# Patient Record
Sex: Female | Born: 1960 | Race: White | Hispanic: No | Marital: Married | Smoking: Former smoker
Health system: Southern US, Community
[De-identification: ages and names within clinical notes are randomized; demographics above are authoritative.]

## PROBLEM LIST (undated history)

## (undated) DIAGNOSIS — M169 Osteoarthritis of hip, unspecified: Secondary | ICD-10-CM

## (undated) DIAGNOSIS — M545 Low back pain, unspecified: Secondary | ICD-10-CM

## (undated) DIAGNOSIS — M47816 Spondylosis without myelopathy or radiculopathy, lumbar region: Secondary | ICD-10-CM

## (undated) DIAGNOSIS — R209 Unspecified disturbances of skin sensation: Secondary | ICD-10-CM

## (undated) HISTORY — DX: Osteoarthritis of hip, unspecified: M16.9

## (undated) HISTORY — DX: Low back pain: M54.5

## (undated) HISTORY — DX: Low back pain, unspecified: M54.50

## (undated) HISTORY — DX: Spondylosis without myelopathy or radiculopathy, lumbar region: M47.816

## (undated) HISTORY — DX: Unspecified disturbances of skin sensation: R20.9

---

## 1986-10-06 HISTORY — PX: BACK SURGERY: SHX140

## 2003-10-07 HISTORY — PX: OTHER SURGICAL HISTORY: SHX169

## 2012-05-13 ENCOUNTER — Encounter: Payer: Self-pay | Admitting: Emergency Medicine

## 2012-05-14 ENCOUNTER — Ambulatory Visit (INDEPENDENT_AMBULATORY_CARE_PROVIDER_SITE_OTHER): Payer: 59 | Admitting: Emergency Medicine

## 2012-05-14 ENCOUNTER — Encounter: Payer: Self-pay | Admitting: Emergency Medicine

## 2012-05-14 VITALS — BP 108/78 | HR 86 | Temp 97.8°F | Ht 60.0 in | Wt 175.0 lb

## 2012-05-14 DIAGNOSIS — R911 Solitary pulmonary nodule: Secondary | ICD-10-CM | POA: Insufficient documentation

## 2012-05-14 NOTE — Assessment & Plan Note (Signed)
Need to repeat Ct scan (2 yr scan) and get the old films for comparison

## 2012-05-14 NOTE — Patient Instructions (Addendum)
We will repeat your CT scan of the chest now Follow with Dr Delton Coombes next available opening after the CT scan is done Bring your old films with you in case we are unable to obtain copies

## 2012-05-14 NOTE — Progress Notes (Signed)
Subjective:    Patient ID: Sherry Green, female    DOB: 09-16-1961, 51 y.o.   MRN: [id number]  HPI 51 yo woman, 10 pk-yrs tobacco stopped 1996, hx of OA/back pain. She apparently underwent CXR and then CT scan May 2011 that identified pulmonary nodule(s) RLL. I do not have the films available today. She was followed by Dr Chancy Milroy, repeat CT scan done in Summer 2012 was reportedly stable. She hasn't had repeat scan yet.  She denies any cough, hemoptysis. Some exertional SOB with stairs, but able to exert.    Review of Systems  Constitutional: Negative for fever, chills, diaphoresis, activity change, appetite change, fatigue and unexpected weight change.  HENT: Positive for postnasal drip and sinus pressure. Negative for nosebleeds, congestion, sore throat, rhinorrhea, sneezing, mouth sores, trouble swallowing, dental problem and voice change.   Eyes: Negative for photophobia, discharge, itching and visual disturbance.  Respiratory: Positive for shortness of breath. Negative for apnea, cough, choking, chest tightness and wheezing.   Cardiovascular: Negative for chest pain, palpitations and leg swelling.  Gastrointestinal: Negative for nausea, vomiting, abdominal pain, constipation, blood in stool and abdominal distention.  Genitourinary: Negative for dysuria, urgency, decreased urine volume and difficulty urinating.  Musculoskeletal: Negative for myalgias and gait problem.  Skin: Negative for rash.  Neurological: Negative for dizziness, seizures, syncope, weakness, light-headedness, numbness and headaches.  Hematological: Bruises/bleeds easily.  Psychiatric/Behavioral: Negative for confusion, disturbed wake/sleep cycle and agitation. The patient is not nervous/anxious.     Past Medical History  Diagnosis Date  . Osteoarthritis of hip   . Lower back pain   . Lumbar spondylosis   . Disturbance of skin sensation     numbness/tingling     Family History  Problem Relation Age of Onset  .  Congenital heart disease      both sides of family  . Dementia    . Diabetes    . Stroke    . Cancer Maternal Grandfather   . Cancer Maternal Aunt     ovarian  . Cancer Mother     rectal  . Clotting disorder Mother     blood clots in legs     History   Social History  . Marital Status: Married    Spouse Name: N/A    Number of Children: 1  . Years of Education: N/A   Occupational History  . collections [employer]   Social History Main Topics  . Smoking status: Former Smoker -- 2.0 packs/day for 5 years    Types: Cigarettes    Quit date: 05/07/1995  . Smokeless tobacco: Never Used   Comment: quit 16 years ago  . Alcohol Use: Yes     occassional  . Drug Use: No  . Sexually Active: Not on file   Other Topics Concern  . Not on file   Social History Narrative  . No narrative on file     Allergies  Allergen Reactions  . No Known Drug Allergy      No outpatient prescriptions prior to visit.         Objective:   Physical Exam Filed Vitals:   05/14/12 1348  BP: 108/78  Pulse: 86  Temp: 97.8 F (36.6 C)   Gen: Pleasant, overwt woman, in no distress,  normal affect  ENT: No lesions,  mouth clear,  oropharynx clear, no postnasal drip  Neck: No JVD, no TMG, no carotid bruits  Lungs: No use of accessory muscles, no dullness to percussion, clear without rales  or rhonchi  Cardiovascular: RRR, heart sounds normal, no murmur or gallops, no peripheral edema  Musculoskeletal: No deformities, no cyanosis or clubbing  Neuro: alert, non focal  Skin: Warm, no lesions or rashes     Assessment & Plan:  Pulmonary nodule, right Need to repeat Ct scan (2 yr scan) and get the old films for comparison

## 2012-05-21 ENCOUNTER — Ambulatory Visit (INDEPENDENT_AMBULATORY_CARE_PROVIDER_SITE_OTHER)
Admission: RE | Admit: 2012-05-21 | Discharge: 2012-05-21 | Disposition: A | Discharge status: Home / Self Care | Payer: 59 | Source: Ambulatory Visit | Attending: Emergency Medicine | Admitting: Emergency Medicine

## 2012-05-21 DIAGNOSIS — R911 Solitary pulmonary nodule: Secondary | ICD-10-CM

## 2012-05-21 MED ORDER — IOHEXOL 300 MG/ML  SOLN
80.0000 mL | Freq: Once | INTRAMUSCULAR | Status: AC | PRN
  Administered 2012-05-21: 80 mL via INTRAVENOUS

## 2012-06-18 ENCOUNTER — Ambulatory Visit (INDEPENDENT_AMBULATORY_CARE_PROVIDER_SITE_OTHER): Payer: 59 | Admitting: Emergency Medicine

## 2012-06-18 ENCOUNTER — Encounter: Payer: Self-pay | Admitting: Emergency Medicine

## 2012-06-18 VITALS — BP 112/68 | HR 75 | Temp 97.7°F | Ht 60.0 in | Wt 150.4 lb

## 2012-06-18 DIAGNOSIS — R911 Solitary pulmonary nodule: Secondary | ICD-10-CM

## 2012-06-18 NOTE — Assessment & Plan Note (Signed)
CT 05/21/12 shows probable stable nodules, although I need to get the comparison films - she says she dropped them off at [facility]. We will compare them, have radiology compare them. If stable then may not need repeat CT but we may discuss other eval of the nodules - ie FOB, etc.

## 2012-06-18 NOTE — Patient Instructions (Addendum)
We will obtain your CT scans from [facility] to compare to your current scan.  Depending on these results we will decide which testing might be appropriate Follow with Dr Delton Coombes in 6 months

## 2012-06-18 NOTE — Progress Notes (Signed)
  Subjective:    Patient ID: Sherry Green, female    DOB: 05/21/1961, 51 y.o.   MRN: [id number]  HPI 51 yo woman, 10 pk-yrs tobacco stopped 1996, hx of OA/back pain. She apparently underwent CXR and then CT scan May 2011 that identified pulmonary nodule(s) RLL. I do not have the films available today. She was followed by Dr Chancy Milroy, repeat CT scan done in Summer 2012 was reportedly stable. She hasn't had repeat scan yet.  She denies any cough, hemoptysis. Some exertional SOB with stairs, but able to exert.   ROV 06/18/12 -- hx tobacco, RLL nodule reportedly seen first in May 2011 at [facility]. She returns now after having repeat scan on 05/21/12:    CT Scan 05/21/12 -  Comparison: None.  Findings: 6 mm subpleural nodule in the posterior right upper lobe  (series 3/image 8). 6 mm subpleural nodule in the posterior left  lower lobe (series/image 11). 4 mm nodule in the left lower lobe  (series 3/image 11). 6 mm subpleural nodule in the right lower  lobe (series 3/image 29). 3 mm subpleural nodule in the right  lower lobe (series 3/image 34).  Increased interstitial markings/subpleural reticulation, suggesting  underlying chronic interstitial lung disease.  No pleural effusion or pneumothorax.  Visualized thyroid is unremarkable.  The heart is normal in size. No pericardial effusion. Mild  atherosclerotic calcifications of the aortic arch.  Small mediastinal/left hilar lymph nodes which do not meet  pathologic CT size criteria.  Visualized upper abdomen is unremarkable.  Degenerative changes of the visualized thoracolumbar spine.  IMPRESSION:  Bilateral pulmonary nodules, measuring up to 6 mm, as described  above. Correlate with prior imaging, if available. Otherwise,  follow-up CT chest is suggested in 6-12 months per [employer]  Society guidelines.  Suspected underlying chronic interstitial lung disease.      Objective:   Physical Exam Filed Vitals:   06/18/12 1322  BP: 112/68   Pulse: 75  Temp: 97.7 F (36.5 C)   Gen: Pleasant, overwt woman, in no distress,  normal affect  ENT: No lesions,  mouth clear,  oropharynx clear, no postnasal drip  Neck: No JVD, no TMG, no carotid bruits  Lungs: No use of accessory muscles, no dullness to percussion, clear without rales or rhonchi  Cardiovascular: RRR, heart sounds normal, no murmur or gallops, no peripheral edema  Musculoskeletal: No deformities, no cyanosis or clubbing  Neuro: alert, non focal  Skin: Warm, no lesions or rashes     Assessment & Plan:  Pulmonary nodule, right CT 05/21/12 shows probable stable nodules, although I need to get the comparison films - she says she dropped them off at [facility]. We will compare them, have radiology compare them. If stable then may not need repeat CT but we may discuss other eval of the nodules - ie FOB, etc.

## 2012-08-25 NOTE — Progress Notes (Signed)
Quick Note:  Spoke with [facility], CT images on disc are being mailed. ______

## 2013-06-30 IMAGING — CT CT CHEST W/ CM
2 of 3 series · 15 of 36 positions shown, 18 images · IV contrast (Omnipaque 300)
Comparison: None.

CLINICAL DATA: Follow up right lower lobe nodule from outside
hospital

CT CHEST WITH CONTRAST
TECHNIQUE: Multidetector CT imaging of the chest was performed
following the standard protocol during bolus administration of
intravenous contrast.
Contrast: 80mL OMNIPAQUE IOHEXOL 300 MG/ML  SOLN

[Series 2: chest routine with · axial · 0.73mm/px · z∈[-250,-30]mm · 12 of 52 slices shown, 15 images]
[im 4/52  mediastinal]
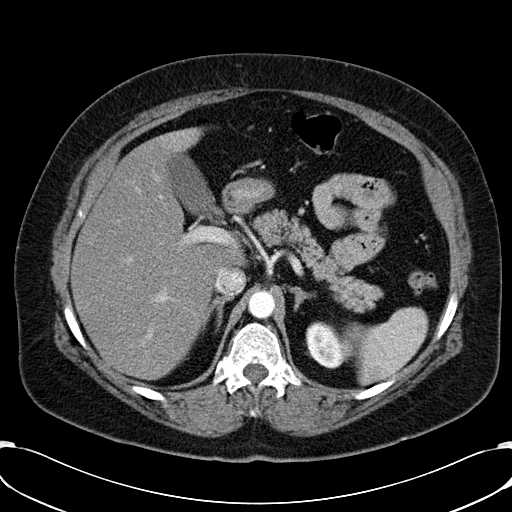
[im 4/52  lung]
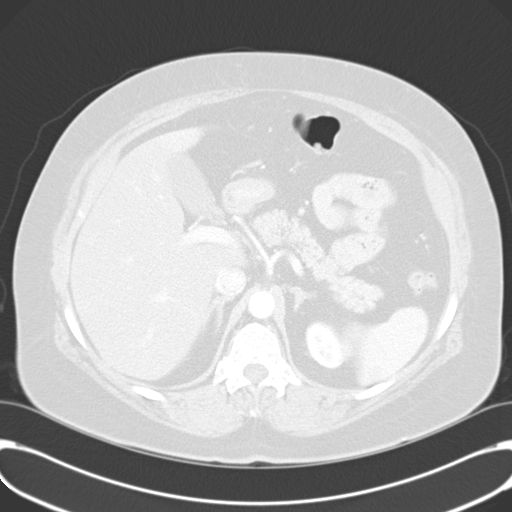
[im 8/52  lung]
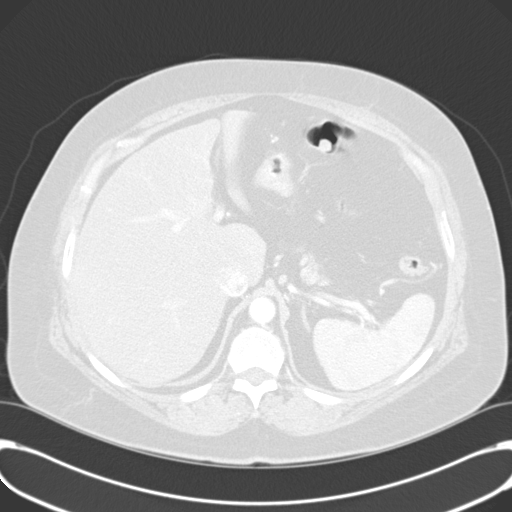
[im 12/52  lung]
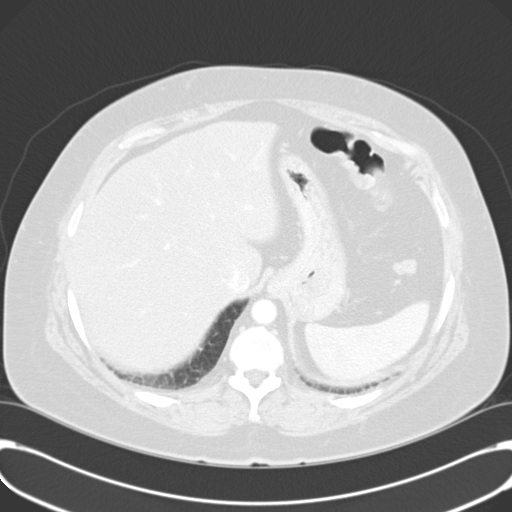
[im 16/52  lung]
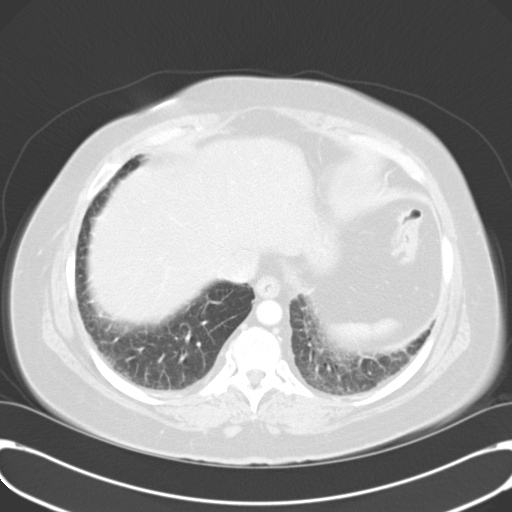
[im 19/52  mediastinal]
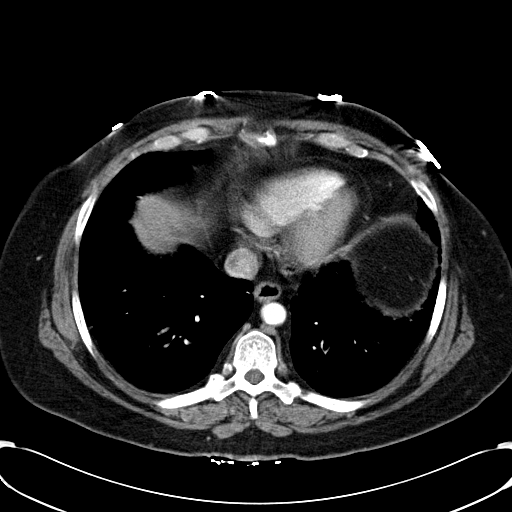
[im 19/52  lung]
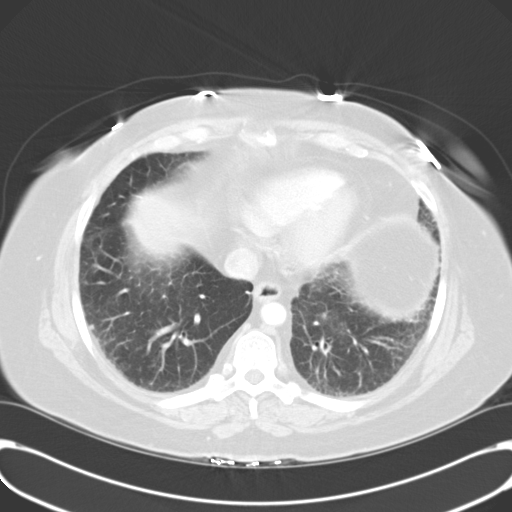
[im 23/52  lung]
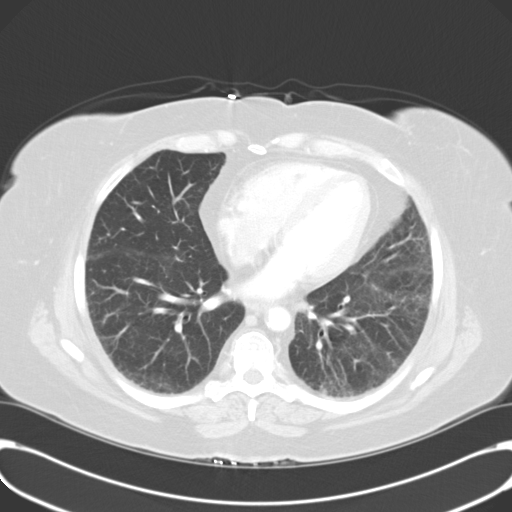
[im 29/52  lung]
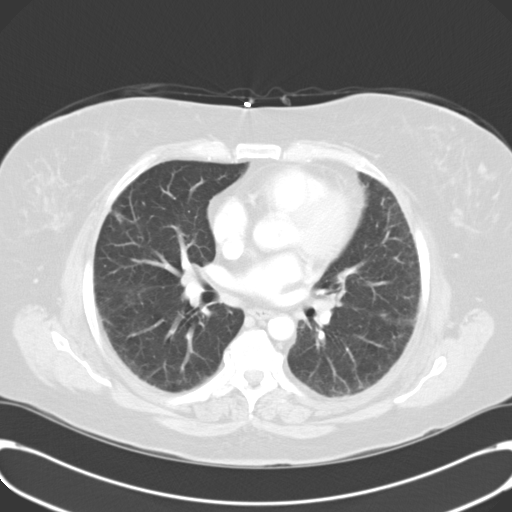
[im 33/52  lung]
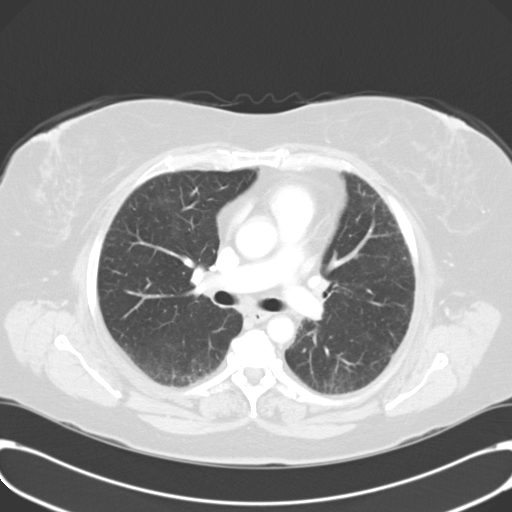
[im 36/52  mediastinal]
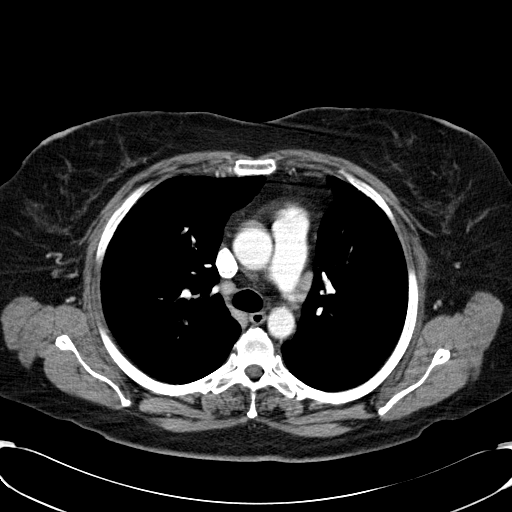
[im 36/52  lung]
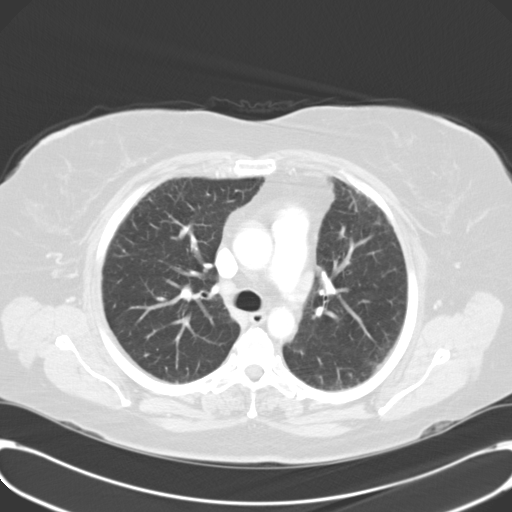
[im 40/52  lung]
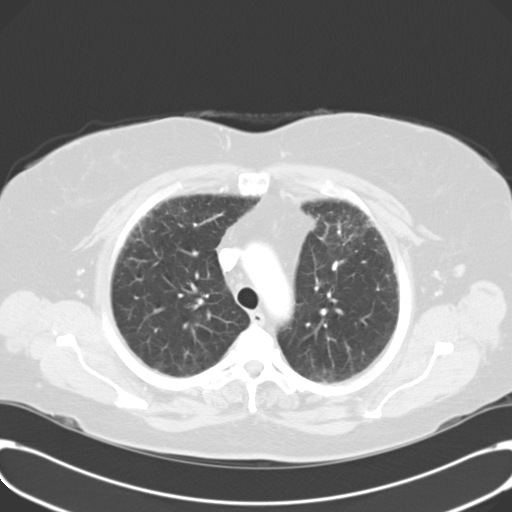
[im 44/52  lung]
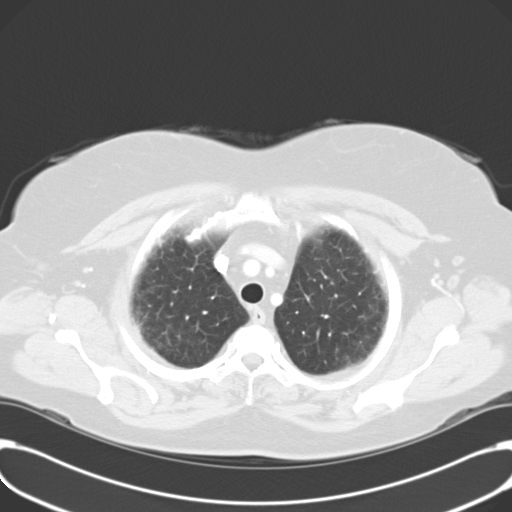
[im 48/52  lung]
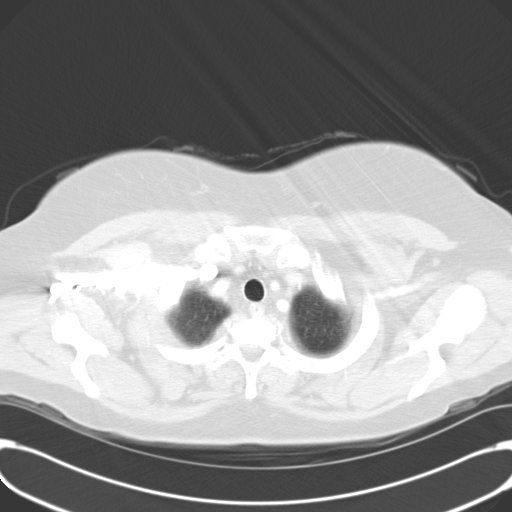

[Series 602: cor · coronal · 0.73mm/px · 3 of 101 slices shown]
[im 21/101  lung]
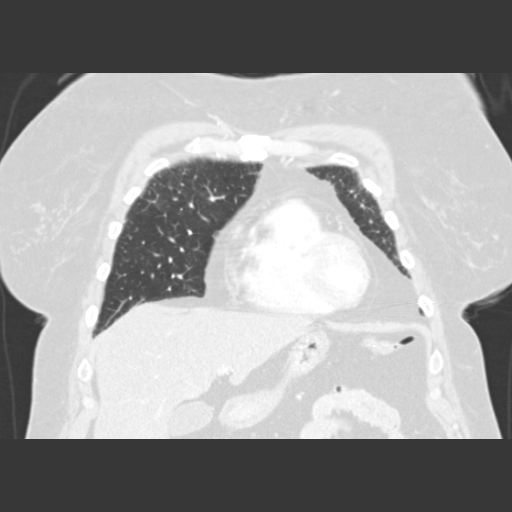
[im 41/101  lung]
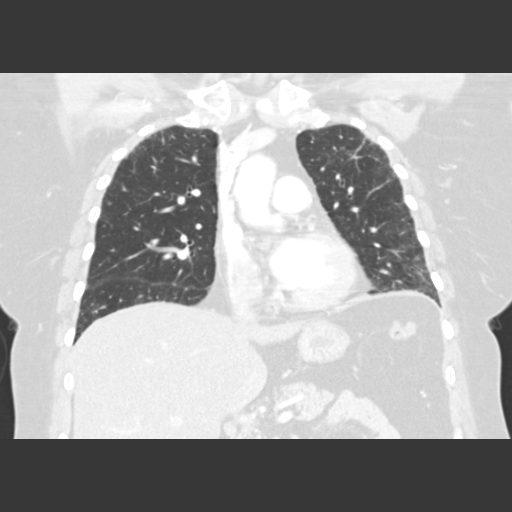
[im 61/101  lung]
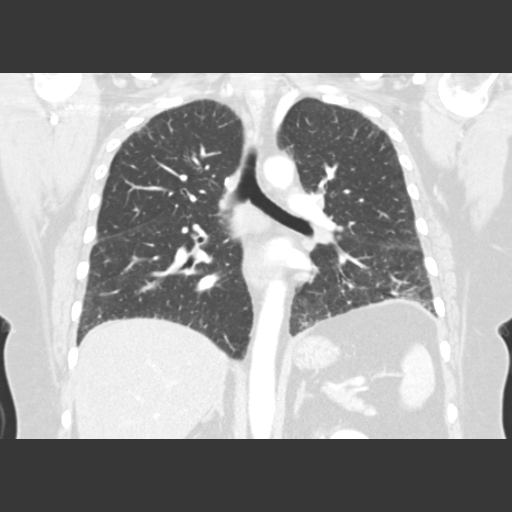

[15 of 36 positions shown; findings below may reference images not displayed]

FINDINGS: 6 mm subpleural nodule in the posterior right upper lobe
(series 3/image 8).  6 mm subpleural nodule in the posterior left
lower lobe (series/image 11). 4 mm nodule in the left lower lobe
(series 3/image 11).  6 mm subpleural nodule in the right lower
lobe (series 3/image 29).  3 mm subpleural nodule in the right
lower lobe (series 3/image 34).

Increased interstitial markings/subpleural reticulation, suggesting
underlying chronic interstitial lung disease.

No pleural effusion or pneumothorax.

Visualized thyroid is unremarkable.

The heart is normal in size.  No pericardial effusion.  Mild
atherosclerotic calcifications of the aortic arch.

Small mediastinal/left hilar lymph nodes which do not meet
pathologic CT size criteria.

Visualized upper abdomen is unremarkable.

Degenerative changes of the visualized thoracolumbar spine.
IMPRESSION: Bilateral pulmonary nodules, measuring up to 6 mm, as described
above.  Correlate with prior imaging, if available.  Otherwise,
follow-up CT chest is suggested in 6-12 months per [place]
Society guidelines.

Suspected underlying chronic interstitial lung disease.

Above recommendation follows the consensus statement: Guidelines
for Management of Small Pulmonary Nodules Detected on CT Scans: A
Statement from the [HOSPITAL] as published in Radiology

## 2013-11-03 ENCOUNTER — Telehealth: Payer: Self-pay | Admitting: Emergency Medicine

## 2013-11-03 NOTE — Telephone Encounter (Signed)
I called and spoke with Rose from [facility]. She will place this on a disk for pt and she can pick this up from them tomorrow.  I called and made pt aware. She needed nothing further.

## 2013-11-03 NOTE — Telephone Encounter (Signed)
Spoke to pt and she said that she never heard back from Dr Delton CoombesByrum regarding comparison of CT scans.  She is still having problems and needs to get CT scan discs form 2012 that were supposedly sent to our office.  Dr Delton CoombesByrum please advise on this

## 2013-11-03 NOTE — Telephone Encounter (Signed)
Spoke with the pt. She was treated for bronchitis and evaluated by cardiology for possible PAH in [place]. She calls now reporting that she wants to obtain her old scan disks, that she had a new CT scan at [facility] 2 weeks ago for comparison, and is now being evaluated for this and for possible PAH by Dr Gerome ApleyBuford. I don't have the [facility] scans - explained that she will have to call them and give permission for a disk to be made. We can, however, make a disk for her of her 2013 CT scan that was done at [facility] [address]. Please call [address] and have them burn a disk of her chest CT scan for her to pick up. Then call her to let her know when she can go get it. She would likel to pick it up tomorrow.

## 2014-05-06 DEATH — deceased

## 2015-01-04 ENCOUNTER — Telehealth: Payer: Self-pay

## 2015-01-04 NOTE — Telephone Encounter (Signed)
01/04/15 Disc received from [facility] and filed on shelf.Jeneen Rinks/[facility]
